# Patient Record
Sex: Female | Born: 1955 | Race: White | Hispanic: No | Marital: Married | Smoking: Never smoker
Health system: Southern US, Community
[De-identification: ages and names within clinical notes are randomized; demographics above are authoritative.]

## PROBLEM LIST (undated history)

## (undated) HISTORY — PX: COLONOSCOPY: SHX5424

---

## 2019-09-16 DIAGNOSIS — Z20828 Contact with and (suspected) exposure to other viral communicable diseases: Secondary | ICD-10-CM | POA: Diagnosis not present

## 2020-11-18 DIAGNOSIS — X32XXXS Exposure to sunlight, sequela: Secondary | ICD-10-CM | POA: Diagnosis not present

## 2020-11-18 DIAGNOSIS — Z808 Family history of malignant neoplasm of other organs or systems: Secondary | ICD-10-CM | POA: Diagnosis not present

## 2020-11-18 DIAGNOSIS — L820 Inflamed seborrheic keratosis: Secondary | ICD-10-CM | POA: Diagnosis not present

## 2020-11-18 DIAGNOSIS — L821 Other seborrheic keratosis: Secondary | ICD-10-CM | POA: Diagnosis not present

## 2020-11-18 DIAGNOSIS — L814 Other melanin hyperpigmentation: Secondary | ICD-10-CM | POA: Diagnosis not present

## 2020-11-18 DIAGNOSIS — D485 Neoplasm of uncertain behavior of skin: Secondary | ICD-10-CM | POA: Diagnosis not present

## 2021-01-07 ENCOUNTER — Ambulatory Visit: Payer: BC Managed Care – PPO | Admitting: Family Medicine

## 2021-01-07 ENCOUNTER — Encounter: Payer: Self-pay | Admitting: Family Medicine

## 2021-01-07 ENCOUNTER — Other Ambulatory Visit: Payer: Self-pay

## 2021-01-07 VITALS — BP 140/70 | HR 86 | Temp 98.1°F | Ht 64.0 in | Wt 151.4 lb

## 2021-01-07 DIAGNOSIS — R928 Other abnormal and inconclusive findings on diagnostic imaging of breast: Secondary | ICD-10-CM

## 2021-01-07 DIAGNOSIS — Z7689 Persons encountering health services in other specified circumstances: Secondary | ICD-10-CM

## 2021-01-07 NOTE — Progress Notes (Signed)
Jacqueline Reynolds is a 65 y.o. female  Chief Complaint  Patient presents with  . Establish Care    Np here to est care.  Pt need a referral for mammogram, she is UTD colonoscopy, bone dx,     HPI: Jacqueline Reynolds is a 65 y.o. female seen today to establish care with our office and needs referral for Lt diagnostic mammo to f/u on likely benign asymmetry in Lt breast on mammo in 09/2020. She is due in 03/2021 for 6 mo f/u.  She moved from [place].   Last PAP: 08/2020 Last mammo: 09/2020 - needs referral for Lt diagnostic mammo in 03/2021 Last colonoscopy: UTD as per pt, about 3-4 years ago and due for f/u in 10 years Last dexa: 08/2020 - osteoporosis  Last CPE, labs in 05/2020   History reviewed. No pertinent past medical history.  History reviewed. No pertinent surgical history.  Social History   Socioeconomic History  . Marital status: Married    Spouse name: Not on file  . Number of children: Not on file  . Years of education: Not on file  . Highest education level: Not on file  Occupational History  . Not on file  Tobacco Use  . Smoking status: Never Smoker  . Smokeless tobacco: Never Used  Substance and Sexual Activity  . Alcohol use: Not Currently  . Drug use: Not Currently  . Sexual activity: Not on file  Other Topics Concern  . Not on file  Social History Narrative  . Not on file   Social Determinants of Health   Financial Resource Strain: Not on file  Food Insecurity: Not on file  Transportation Needs: Not on file  Physical Activity: Not on file  Stress: Not on file  Social Connections: Not on file  Intimate Partner Violence: Not on file    Family History  Problem Relation Age of Onset  . Diabetes Mother   . Hypertension Mother   . Arthritis Mother   . Cancer Mother   . Hypertension Father       There is no immunization history on file for this patient.  Outpatient Encounter Medications as of 01/07/2021  Medication Sig  . Cholecalciferol (VITAMIN D3) 1.25  MG (50000 UT) CAPS Take by mouth.   No facility-administered encounter medications on file as of 01/07/2021.     ROS: Pertinent positives and negatives noted in HPI. Remainder of ROS non-contributory   No Known Allergies  BP 140/70 (BP Location: Left Arm, Patient Position: Sitting, Cuff Size: Normal)   Pulse 86   Temp 98.1 F (36.7 C) (Oral)   Ht 5\' 4"  (1.626 m)   Wt 151 lb 6.4 oz (68.7 kg)   SpO2 98%   BMI 25.99 kg/m   Wt Readings from Last 3 Encounters:  01/07/21 151 lb 6.4 oz (68.7 kg)   Temp Readings from Last 3 Encounters:  01/07/21 98.1 F (36.7 C) (Oral)   BP Readings from Last 3 Encounters:  01/07/21 140/70   Pulse Readings from Last 3 Encounters:  01/07/21 86     Physical Exam Constitutional:      General: She is not in acute distress.    Appearance: Normal appearance. She is not ill-appearing.  Pulmonary:     Effort: No respiratory distress.  Neurological:     Mental Status: She is alert and oriented to person, place, and time.  Psychiatric:        Mood and Affect: Mood normal.  Behavior: Behavior normal.      A/P:  1. Encounter to establish care with new doctor - due for CPE in 05/2021 - UTD on dexa, colonoscopy, PAP  2. Abnormal mammogram of left breast - due in 03/2021 for Lt diagnostic mammo after abnormal in 09/2020 - [facility]; Future    This visit occurred during the SARS-CoV-2 public health emergency.  Safety protocols were in place, including screening questions prior to the visit, additional usage of staff PPE, and extensive cleaning of exam room while observing appropriate contact time as indicated for disinfecting solutions.

## 2021-04-08 ENCOUNTER — Other Ambulatory Visit: Payer: Self-pay | Admitting: Family Medicine

## 2021-04-08 DIAGNOSIS — N6489 Other specified disorders of breast: Secondary | ICD-10-CM

## 2021-04-24 ENCOUNTER — Other Ambulatory Visit: Payer: Self-pay | Admitting: Family Medicine

## 2021-04-24 DIAGNOSIS — N6489 Other specified disorders of breast: Secondary | ICD-10-CM

## 2021-05-01 ENCOUNTER — Other Ambulatory Visit: Payer: Self-pay

## 2021-05-01 ENCOUNTER — Ambulatory Visit
Admission: RE | Admit: 2021-05-01 | Discharge: 2021-05-01 | Disposition: A | Discharge status: Home / Self Care | Payer: Medicare Other | Source: Ambulatory Visit | Attending: Family Medicine | Admitting: Family Medicine

## 2021-05-01 ENCOUNTER — Other Ambulatory Visit: Payer: Self-pay | Admitting: Family Medicine

## 2021-05-01 DIAGNOSIS — N6489 Other specified disorders of breast: Secondary | ICD-10-CM

## 2021-06-03 ENCOUNTER — Encounter: Payer: BLUE CROSS/BLUE SHIELD | Admitting: Family Medicine

## 2021-07-09 ENCOUNTER — Other Ambulatory Visit: Payer: Self-pay | Admitting: Family Medicine

## 2021-07-09 DIAGNOSIS — Z1231 Encounter for screening mammogram for malignant neoplasm of breast: Secondary | ICD-10-CM

## 2021-09-23 ENCOUNTER — Ambulatory Visit
Admission: RE | Admit: 2021-09-23 | Discharge: 2021-09-23 | Disposition: A | Discharge status: Home / Self Care | Payer: Medicare HMO | Source: Ambulatory Visit | Attending: Family Medicine | Admitting: Family Medicine

## 2021-09-23 DIAGNOSIS — Z1231 Encounter for screening mammogram for malignant neoplasm of breast: Secondary | ICD-10-CM

## 2022-07-30 ENCOUNTER — Other Ambulatory Visit: Payer: Self-pay | Admitting: Family Medicine

## 2022-07-30 DIAGNOSIS — Z1231 Encounter for screening mammogram for malignant neoplasm of breast: Secondary | ICD-10-CM

## 2022-07-30 DIAGNOSIS — M810 Age-related osteoporosis without current pathological fracture: Secondary | ICD-10-CM

## 2022-10-05 ENCOUNTER — Ambulatory Visit
Admission: RE | Admit: 2022-10-05 | Discharge: 2022-10-05 | Disposition: A | Discharge status: Home / Self Care | Payer: Medicare HMO | Source: Ambulatory Visit | Attending: Family Medicine | Admitting: Family Medicine

## 2022-10-05 DIAGNOSIS — Z1231 Encounter for screening mammogram for malignant neoplasm of breast: Secondary | ICD-10-CM

## 2023-02-02 ENCOUNTER — Other Ambulatory Visit: Payer: Medicare HMO

## 2023-02-02 ENCOUNTER — Ambulatory Visit
Admission: RE | Admit: 2023-02-02 | Discharge: 2023-02-02 | Disposition: A | Discharge status: Home / Self Care | Payer: Medicare HMO | Source: Ambulatory Visit | Attending: Family Medicine | Admitting: Family Medicine

## 2023-02-02 DIAGNOSIS — M810 Age-related osteoporosis without current pathological fracture: Secondary | ICD-10-CM

## 2023-09-23 ENCOUNTER — Other Ambulatory Visit: Payer: Self-pay | Admitting: Family Medicine

## 2023-09-23 DIAGNOSIS — Z1231 Encounter for screening mammogram for malignant neoplasm of breast: Secondary | ICD-10-CM

## 2023-10-11 ENCOUNTER — Ambulatory Visit
Admission: RE | Admit: 2023-10-11 | Discharge: 2023-10-11 | Disposition: A | Discharge status: Home / Self Care | Payer: Medicare HMO | Source: Ambulatory Visit | Attending: Family Medicine | Admitting: Family Medicine

## 2023-10-11 DIAGNOSIS — Z1231 Encounter for screening mammogram for malignant neoplasm of breast: Secondary | ICD-10-CM

## 2023-11-01 ENCOUNTER — Encounter (HOSPITAL_BASED_OUTPATIENT_CLINIC_OR_DEPARTMENT_OTHER): Payer: Self-pay | Admitting: Orthopaedic Surgery

## 2023-11-02 NOTE — H&P (Signed)
PREOPERATIVE H&P  Chief Complaint: LEFT DISTAL RADIUS FRACTURE  HPI: Jacqueline Reynolds is a 68 y.o. female who is scheduled for, Procedure(s): OPEN REDUCTION INTERNAL FIXATION (ORIF) DISTAL RADIUS FRACTURE.   The patient is a healthy, active 68 year old female who fell off a ski lift in [place] on Monday. She had some trouble getting back home. She now has numbness and tingling in the small finger but she thinks it is related to the splint.  No previous issues with her nerves. She had a wrist fracture which was reduced in the ER.   Symptoms are rated as moderate to severe, and have been worsening.  This is significantly impairing activities of daily living.    Please see clinic note for further details on this patient's care.    She has elected for surgical management.   No past medical history on file. Past Surgical History:  Procedure Laterality Date   COLONOSCOPY     Social History   Socioeconomic History   Marital status: Married    Spouse name: Not on file   Number of children: Not on file   Years of education: Not on file   Highest education level: Not on file  Occupational History   Not on file  Tobacco Use   Smoking status: Never   Smokeless tobacco: Never  Substance and Sexual Activity   Alcohol use: Not Currently   Drug use: Not Currently   Sexual activity: Not on file  Other Topics Concern   Not on file  Social History Narrative   Not on file   Social Drivers of Health   Financial Resource Strain: Not on file  Food Insecurity: Not on file  Transportation Needs: Not on file  Physical Activity: Sufficiently Active (09/09/2022)   [facility]   Exercise Vital Sign    Days of Exercise per Week: 5 days    Minutes of Exercise per Session: 40 min  Stress: Not on file  Social Connections: Not on file   Family History  Problem Relation Age of Onset   Diabetes Mother    Hypertension Mother    Arthritis Mother     Cancer Mother    Hypertension Father    BRCA 1/2 Neg Hx    Breast cancer Neg Hx    No Known Allergies Prior to Admission medications   Medication Sig Start Date End Date Taking? Authorizing Provider  calcium carbonate (OS-CAL - DOSED IN MG OF ELEMENTAL CALCIUM) 1250 (500 Ca) MG tablet Take 1 tablet by mouth.   Yes [provider]  Cholecalciferol (VITAMIN D3) 1.25 MG (50000 UT) CAPS Take by mouth.   Yes [provider]  magnesium 30 MG tablet Take 30 mg by mouth 2 (two) times daily.   Yes [provider]    ROS: All other systems have been reviewed and were otherwise negative with the exception of those mentioned in the HPI and as above.  Physical Exam: General: Alert, no acute distress Cardiovascular: No pedal edema Respiratory: No cyanosis, no use of accessory musculature GI: No organomegaly, abdomen is soft and non-tender Skin: No lesions in the area of chief complaint Neurologic: Sensation intact distally Psychiatric: Patient is competent for consent with normal mood and affect Lymphatic: No axillary or cervical lymphadenopathy  MUSCULOSKELETAL:  The range of motion is not tested in the early setting after a known fracture.  Sensation seems to be altered int the ulnar nerve but it is difficult  to assess motor and sensory function secondary to her splint.   Imaging: X-rays were imported into our system and reviewed demonstrating a complex intra-articular distal radius fracture of greater than three fragments with improvement after reduction but continued dorsal tilt.  Assessment: LEFT DISTAL RADIUS FRACTURE  Plan: Plan for Procedure(s): OPEN REDUCTION INTERNAL FIXATION (ORIF) DISTAL RADIUS FRACTURE  The risks benefits and alternatives were discussed with the patient including but not limited to the risks of nonoperative treatment, versus surgical intervention including infection, bleeding, nerve injury,  blood clots, cardiopulmonary complications,  morbidity, mortality, among others, and they were willing to proceed.   The patient acknowledged the explanation, agreed to proceed with the plan and consent was signed.   Operative Plan: ORIF left distal radius fracture Discharge Medications: standard DVT Prophylaxis: none Physical Therapy: outpatient Special Discharge needs: splint. sling   Vernetta Honey, PA-C  11/02/2023 8:36 PM

## 2023-11-03 ENCOUNTER — Ambulatory Visit (HOSPITAL_BASED_OUTPATIENT_CLINIC_OR_DEPARTMENT_OTHER): Payer: Medicare HMO | Admitting: Certified Registered"

## 2023-11-03 ENCOUNTER — Ambulatory Visit (HOSPITAL_BASED_OUTPATIENT_CLINIC_OR_DEPARTMENT_OTHER): Payer: Medicare HMO

## 2023-11-03 ENCOUNTER — Ambulatory Visit (HOSPITAL_BASED_OUTPATIENT_CLINIC_OR_DEPARTMENT_OTHER)
Admission: RE | Admit: 2023-11-03 | Discharge: 2023-11-03 | Disposition: A | Discharge status: Home / Self Care | Payer: Medicare HMO | Attending: Orthopaedic Surgery | Admitting: Orthopaedic Surgery

## 2023-11-03 ENCOUNTER — Other Ambulatory Visit: Payer: Self-pay

## 2023-11-03 ENCOUNTER — Encounter (HOSPITAL_BASED_OUTPATIENT_CLINIC_OR_DEPARTMENT_OTHER)
Admission: RE | Disposition: A | Discharge status: Home / Self Care | Payer: Self-pay | Source: Home / Self Care | Attending: Orthopaedic Surgery

## 2023-11-03 ENCOUNTER — Encounter (HOSPITAL_BASED_OUTPATIENT_CLINIC_OR_DEPARTMENT_OTHER): Payer: Self-pay | Admitting: Orthopaedic Surgery

## 2023-11-03 DIAGNOSIS — W1789XA Other fall from one level to another, initial encounter: Secondary | ICD-10-CM | POA: Insufficient documentation

## 2023-11-03 DIAGNOSIS — S52572A Other intraarticular fracture of lower end of left radius, initial encounter for closed fracture: Secondary | ICD-10-CM

## 2023-11-03 DIAGNOSIS — Z01818 Encounter for other preprocedural examination: Secondary | ICD-10-CM

## 2023-11-03 HISTORY — PX: OPEN REDUCTION INTERNAL FIXATION (ORIF) DISTAL RADIAL FRACTURE: SHX5989

## 2023-11-03 SURGERY — OPEN REDUCTION INTERNAL FIXATION (ORIF) DISTAL RADIUS FRACTURE
Anesthesia: Regional | Site: Wrist | Laterality: Left

## 2023-11-03 MED ORDER — ROPIVACAINE HCL 5 MG/ML IJ SOLN
INTRAMUSCULAR | Status: DC | PRN
  Administered 2023-11-03: 30 mL via PERINEURAL

## 2023-11-03 MED ORDER — MIDAZOLAM HCL 2 MG/2ML IJ SOLN
INTRAMUSCULAR | Status: AC
  Filled 2023-11-03: qty 2

## 2023-11-03 MED ORDER — PROPOFOL 500 MG/50ML IV EMUL
INTRAVENOUS | Status: DC | PRN
  Administered 2023-11-03: 100 ug/kg/min via INTRAVENOUS

## 2023-11-03 MED ORDER — ONDANSETRON HCL 4 MG/2ML IJ SOLN
INTRAMUSCULAR | Status: AC
  Filled 2023-11-03: qty 2

## 2023-11-03 MED ORDER — GABAPENTIN 300 MG PO CAPS
300.0000 mg | ORAL_CAPSULE | Freq: Once | ORAL | Status: AC
  Administered 2023-11-03: 300 mg via ORAL

## 2023-11-03 MED ORDER — PROPOFOL 10 MG/ML IV BOLUS
INTRAVENOUS | Status: DC | PRN
  Administered 2023-11-03: 30 mg via INTRAVENOUS

## 2023-11-03 MED ORDER — VANCOMYCIN HCL 1000 MG IV SOLR
INTRAVENOUS | Status: AC
  Filled 2023-11-03: qty 20

## 2023-11-03 MED ORDER — ONDANSETRON HCL 4 MG/2ML IJ SOLN
INTRAMUSCULAR | Status: DC | PRN
  Administered 2023-11-03: 4 mg via INTRAVENOUS

## 2023-11-03 MED ORDER — VITAMIN C 250 MG PO TABS: 500.0000 mg | ORAL_TABLET | Freq: Every day | ORAL | 0 refills | Status: AC

## 2023-11-03 MED ORDER — FENTANYL CITRATE (PF) 100 MCG/2ML IJ SOLN
50.0000 ug | Freq: Once | INTRAMUSCULAR | Status: AC
  Administered 2023-11-03: 50 ug via INTRAVENOUS

## 2023-11-03 MED ORDER — FENTANYL CITRATE (PF) 100 MCG/2ML IJ SOLN
INTRAMUSCULAR | Status: AC
  Filled 2023-11-03: qty 2

## 2023-11-03 MED ORDER — 0.9 % SODIUM CHLORIDE (POUR BTL) OPTIME
TOPICAL | Status: DC | PRN
  Administered 2023-11-03: 200 mL

## 2023-11-03 MED ORDER — PHENYLEPHRINE 80 MCG/ML (10ML) SYRINGE FOR IV PUSH (FOR BLOOD PRESSURE SUPPORT)
PREFILLED_SYRINGE | INTRAVENOUS | Status: AC
  Filled 2023-11-03: qty 30

## 2023-11-03 MED ORDER — PROPOFOL 500 MG/50ML IV EMUL
INTRAVENOUS | Status: AC
  Filled 2023-11-03: qty 50

## 2023-11-03 MED ORDER — ONDANSETRON HCL 4 MG PO TABS: 4.0000 mg | ORAL_TABLET | Freq: Three times a day (TID) | ORAL | 0 refills | Status: AC | PRN

## 2023-11-03 MED ORDER — GABAPENTIN 300 MG PO CAPS
ORAL_CAPSULE | ORAL | Status: AC
  Filled 2023-11-03: qty 1

## 2023-11-03 MED ORDER — MIDAZOLAM HCL 2 MG/2ML IJ SOLN: 1.0000 mg | Freq: Once | INTRAMUSCULAR | Status: DC

## 2023-11-03 MED ORDER — CEFAZOLIN SODIUM-DEXTROSE 2-4 GM/100ML-% IV SOLN
2.0000 g | INTRAVENOUS | Status: AC
  Administered 2023-11-03: 2 g via INTRAVENOUS

## 2023-11-03 MED ORDER — PHENYLEPHRINE HCL (PRESSORS) 10 MG/ML IV SOLN
INTRAVENOUS | Status: DC | PRN
  Administered 2023-11-03: 80 ug via INTRAVENOUS

## 2023-11-03 MED ORDER — LACTATED RINGERS IV SOLN
INTRAVENOUS | Status: DC
Start: 2023-11-03 — End: 2023-11-03

## 2023-11-03 MED ORDER — CEFAZOLIN SODIUM-DEXTROSE 2-4 GM/100ML-% IV SOLN
INTRAVENOUS | Status: AC
  Filled 2023-11-03: qty 100

## 2023-11-03 MED ORDER — MIDAZOLAM HCL 2 MG/2ML IJ SOLN
2.0000 mg | Freq: Once | INTRAMUSCULAR | Status: AC
  Administered 2023-11-03: 2 mg via INTRAVENOUS

## 2023-11-03 MED ORDER — ACETAMINOPHEN 500 MG PO TABS
1000.0000 mg | ORAL_TABLET | Freq: Once | ORAL | Status: AC
  Administered 2023-11-03: 1000 mg via ORAL

## 2023-11-03 MED ORDER — ACETAMINOPHEN 500 MG PO TABS
ORAL_TABLET | ORAL | Status: AC
  Filled 2023-11-03: qty 2

## 2023-11-03 MED ORDER — OXYCODONE HCL 5 MG PO TABS: ORAL_TABLET | ORAL | 0 refills | Status: AC

## 2023-11-03 MED ORDER — VANCOMYCIN HCL 1000 MG IV SOLR
INTRAVENOUS | Status: DC | PRN
  Administered 2023-11-03: 1000 mg via TOPICAL

## 2023-11-03 MED ORDER — SODIUM CHLORIDE 0.9 % IV SOLN
INTRAVENOUS | Status: DC | PRN
Start: 2023-11-03 — End: 2023-11-03

## 2023-11-03 MED ORDER — PROPOFOL 10 MG/ML IV BOLUS
INTRAVENOUS | Status: AC
  Filled 2023-11-03: qty 20

## 2023-11-03 SURGICAL SUPPLY — 56 items
BIT DRILL 2.2 SS TIBIAL (BIT) IMPLANT
BLADE HEX COATED 2.75 (ELECTRODE) ×1 IMPLANT
BLADE SURG 15 STRL LF DISP TIS (BLADE) ×1 IMPLANT
BNDG COHESIVE 3X5 TAN ST LF (GAUZE/BANDAGES/DRESSINGS) ×1 IMPLANT
BNDG ELASTIC 3INX 5YD STR LF (GAUZE/BANDAGES/DRESSINGS) ×1 IMPLANT
BNDG ELASTIC 4INX 5YD STR LF (GAUZE/BANDAGES/DRESSINGS) ×1 IMPLANT
BNDG ESMARK 4X9 LF (GAUZE/BANDAGES/DRESSINGS) ×1 IMPLANT
BRUSH SCRUB EZ PLAIN DRY (MISCELLANEOUS) ×1 IMPLANT
CANISTER SUCT 1200ML W/VALVE (MISCELLANEOUS) IMPLANT
CLSR STERI-STRIP ANTIMIC 1/2X4 (GAUZE/BANDAGES/DRESSINGS) ×1 IMPLANT
COVER BACK TABLE 60X90IN (DRAPES) ×1 IMPLANT
CUFF TOURN SGL QUICK 18X4 (TOURNIQUET CUFF) ×1 IMPLANT
DRAPE EXTREMITY T 121X128X90 (DISPOSABLE) ×1 IMPLANT
DRAPE IMP U-DRAPE 54X76 (DRAPES) ×1 IMPLANT
DRAPE OEC MINIVIEW 54X84 (DRAPES) ×1 IMPLANT
DRAPE SURG 17X23 STRL (DRAPES) ×1 IMPLANT
ELECT REM PT RETURN 9FT ADLT (ELECTROSURGICAL) ×1 IMPLANT
ELECTRODE REM PT RTRN 9FT ADLT (ELECTROSURGICAL) ×1 IMPLANT
GAUZE SPONGE 4X4 12PLY STRL (GAUZE/BANDAGES/DRESSINGS) ×1 IMPLANT
GLOVE BIO SURGEON STRL SZ 6.5 (GLOVE) ×1 IMPLANT
GLOVE BIOGEL PI IND STRL 6.5 (GLOVE) ×1 IMPLANT
GLOVE BIOGEL PI IND STRL 8 (GLOVE) ×1 IMPLANT
GLOVE ECLIPSE 8.0 STRL XLNG CF (GLOVE) ×1 IMPLANT
GOWN STRL REUS W/ TWL LRG LVL3 (GOWN DISPOSABLE) ×1 IMPLANT
GOWN STRL REUS W/ TWL XL LVL3 (GOWN DISPOSABLE) IMPLANT
GOWN STRL REUS W/TWL XL LVL3 (GOWN DISPOSABLE) ×1 IMPLANT
K-WIRE FX5X1.6XNS BN SS (WIRE) ×2 IMPLANT
KWIRE FX5X1.6XNS BN SS (WIRE) IMPLANT
NDL HYPO 25X1 1.5 SAFETY (NEEDLE) ×1 IMPLANT
NEEDLE HYPO 25X1 1.5 SAFETY (NEEDLE) ×1 IMPLANT
NS IRRIG 1000ML POUR BTL (IV SOLUTION) IMPLANT
PACK BASIN DAY SURGERY FS (CUSTOM PROCEDURE TRAY) ×1 IMPLANT
PAD CAST 4YDX4 CTTN HI CHSV (CAST SUPPLIES) ×1 IMPLANT
PEG LOCKING SMOOTH 2.2X14 (Peg) IMPLANT
PEG LOCKING SMOOTH 2.2X16 (Screw) IMPLANT
PEG LOCKING SMOOTH 2.2X18 (Peg) IMPLANT
PEG LOCKING SMOOTH 2.2X20 (Screw) IMPLANT
PENCIL SMOKE EVACUATOR (MISCELLANEOUS) ×1 IMPLANT
PLATE NARROW DVR LEFT (Plate) IMPLANT
SCREW BN 14X2.7XNONLOCK 3 LD (Screw) IMPLANT
SCREW LOCKING 2.7X13MM (Screw) IMPLANT
SCREW LP NL 2.7X13MM (Screw) IMPLANT
SCREW NONLOCK 2.7X20MM (Screw) IMPLANT
SLEEVE SCD COMPRESS KNEE MED (STOCKING) ×1 IMPLANT
SPIKE FLUID TRANSFER (MISCELLANEOUS) IMPLANT
SPLINT PLASTER CAST XFAST 3X15 (CAST SUPPLIES) ×10 IMPLANT
SPONGE T-LAP 4X18 ~~LOC~~+RFID (SPONGE) IMPLANT
SUCTION TUBE FRAZIER 10FR DISP (SUCTIONS) IMPLANT
SUT MNCRL AB 4-0 PS2 18 (SUTURE) ×1 IMPLANT
SUT VIC AB 3-0 SH 27X BRD (SUTURE) IMPLANT
SYR BULB EAR ULCER 3OZ GRN STR (SYRINGE) ×1 IMPLANT
SYR CONTROL 10ML LL (SYRINGE) ×1 IMPLANT
TOWEL GREEN STERILE FF (TOWEL DISPOSABLE) ×1 IMPLANT
TRAY DSU PREP LF (CUSTOM PROCEDURE TRAY) ×1 IMPLANT
TUBE CONNECTING 20X1/4 (TUBING) ×1 IMPLANT
YANKAUER SUCT BULB TIP NO VENT (SUCTIONS) IMPLANT

## 2023-11-03 NOTE — Anesthesia Postprocedure Evaluation (Signed)
Anesthesia Post Note  Patient: Jacqueline Reynolds  Procedure(s) Performed: OPEN REDUCTION INTERNAL FIXATION (ORIF) DISTAL RADIUS FRACTURE (Left: Wrist)     Patient location during evaluation: PACU Anesthesia Type: Regional and MAC Level of consciousness: awake and alert Pain management: pain level controlled Vital Signs Assessment: post-procedure vital signs reviewed and stable Respiratory status: spontaneous breathing, nonlabored ventilation and respiratory function stable Cardiovascular status: blood pressure returned to baseline and stable Postop Assessment: no apparent nausea or vomiting Anesthetic complications: no   No notable events documented.  Last Vitals:  Vitals:   11/03/23 1315 11/03/23 1335  BP: 129/76 (!) 154/82  Pulse: 68 80  Resp: 14 14  Temp:  (!) 36.3 C  SpO2: 97% 97%    Last Pain:  Vitals:   11/03/23 1335  TempSrc:   PainSc: 0-No pain                 Lowella Curb

## 2023-11-03 NOTE — Progress Notes (Signed)
Assisted Dr. Bradley Ferris with left, supraclavicular, ultrasound guided block. Side rails up, monitors on throughout procedure. See vital signs in flow sheet. Tolerated Procedure well.

## 2023-11-03 NOTE — Discharge Instructions (Addendum)
 Ramond Marrow MD, MPH Alfonse Alpers, PA-C [facility] [address] [phone] (tel)   [phone] (fax)   POST-OPERATIVE INSTRUCTIONS   WOUND CARE Please keep splint clean dry and intact until followup.  You may shower on Post-Op Day #3.  You must keep splint dry during this process and may find that a plastic bag taped around the extremity or alternatively a towel based bath may be a better option.   If you get your splint wet or if it is damaged please contact our clinic.  EXERCISES Due to your splint being in place you will not be able to bear weight through your extremity.    You may use a sling for comfort   It is normal for your fingers/hand to become more swollen after surgery and discolored from bruising.   This will resolve over the first few weeks usually after surgery. Please continue to ambulate and do not stay sitting or lying for too long.  Perform foot and wrist pumps to assist in circulation.   REGIONAL ANESTHESIA (NERVE BLOCKS) The anesthesia team may have performed a nerve block for you this is a great tool used to minimize pain.   The block may start wearing off overnight (between 8-24 hours postop) When the block wears off, your pain may go from nearly zero to the pain you would have had postop without the block. This is an abrupt transition but nothing dangerous is happening.   This can be a challenging period but utilize your as needed pain medications to try and manage this period. We suggest you use the pain medication the first night prior to going to bed, to ease this transition.  You may take an extra dose of narcotic when this happens if needed   POST-OP MEDICATIONS- Multimodal approach to pain control In general your pain will be controlled with a combination of substances.  Prescriptions unless otherwise discussed are electronically sent to your pharmacy.  This is a carefully made plan we use to minimize  narcotic use.     Celebrex - Anti-inflammatory medication taken on a scheduled basis Acetaminophen - Non-narcotic pain medicine taken on a scheduled basis  Oxycodone - This is a strong narcotic, to be used only on an "as needed" basis for SEVERE pain. Zofran - take as needed for nausea Vitamin C - take daily for 60 days  FOLLOW-UP If you develop a Fever (>101.5), Redness or Drainage from the surgical incision site, please call our office to arrange for an evaluation. Please call the office to schedule a follow-up appointment for your incision check if you do not already have one, 7-10 days post-operatively.   HELPFUL INFORMATION    You may be more comfortable sleeping in a semi-seated position the first few nights following surgery.  Keep a pillow propped under the elbow and forearm for comfort.  If you have a recliner type of chair it might be beneficial.  If not that is fine too, but it would be helpful to sleep propped up with pillows behind your operated shoulder as well under your elbow and forearm.  This will reduce pulling on the suture lines.   When dressing, put your operative arm in the sleeve first.  When getting undressed, take your operative arm out last.  Loose fitting, button-down shirts are recommended.  Often in the first days after surgery you may be more comfortable keeping your operative arm under your shirt and not through the sleeve.  You may return to work/school in the next couple of days when you feel up to it.  Desk work and typing in the sling is fine.   We suggest you use the pain medication the first night prior to going to bed, in order to ease any pain when the anesthesia wears off. You should avoid taking pain medications on an empty stomach as it will make you nauseous.   You should wean off your narcotic medicines as soon as you are able.  Most patients will be off narcotics before their first postop appointment.    Do not drink alcoholic beverages or take  illicit drugs when taking pain medications.   It is against the law to drive while taking narcotics.  In some states it is against the law to drive while your arm is in a sling.    Pain medication may make you constipated.  Below are a few solutions to try in this order:   - Decrease the amount of pain medication if you aren't having pain.   - Drink lots of decaffeinated fluids.   - Drink prune juice and/or each dried prunes   If the first 3 don't work start with additional solutions   - Take Colace - an over-the-counter stool softener   - Take Senokot - an over-the-counter laxative   - Take Miralax - a stronger over-the-counter laxative   For more information including helpful videos and documents visit our website:   [email or web address]    Post Anesthesia Home Care Instructions  Activity: Get plenty of rest for the remainder of the day. A responsible individual must stay with you for 24 hours following the procedure.  For the next 24 hours, DO NOT: -Drive a car -Advertising copywriter -Drink alcoholic beverages -Take any medication unless instructed by your physician -Make any legal decisions or sign important papers.  Meals: Start with liquid foods such as gelatin or soup. Progress to regular foods as tolerated. Avoid greasy, spicy, heavy foods. If nausea and/or vomiting occur, drink only clear liquids until the nausea and/or vomiting subsides. Call your physician if vomiting continues.  Special Instructions/Symptoms: Your throat may feel dry or sore from the anesthesia or the breathing tube placed in your throat during surgery. If this causes discomfort, gargle with warm salt water. The discomfort should disappear within 24 hours.  If you had a scopolamine patch placed behind your ear for the management of post- operative nausea and/or vomiting:  1. The medication in the patch is effective for 72 hours, after which it should be removed.  Wrap  patch in a tissue and discard in the trash. Wash hands thoroughly with soap and water. 2. You may remove the patch earlier than 72 hours if you experience unpleasant side effects which may include dry mouth, dizziness or visual disturbances. 3. Avoid touching the patch. Wash your hands with soap and water after contact with the patch.      Next dose of Tyelnol may be taken at 5p  Regional Anesthesia Blocks  1. You may not be able to move or feel the "blocked" extremity after a regional anesthetic block. This may last may last from 3-48 hours after placement, but it will go away. The length of time depends on the medication injected and your individual response to the medication. As the nerves start to wake up, you may experience tingling as the movement and feeling returns to your extremity. If the numbness and inability to move your extremity has not gone  away after 48 hours, please call your surgeon.   2. The extremity that is blocked will need to be protected until the numbness is gone and the strength has returned. Because you cannot feel it, you will need to take extra care to avoid injury. Because it may be weak, you may have difficulty moving it or using it. You may not know what position it is in without looking at it while the block is in effect.  3. For blocks in the legs and feet, returning to weight bearing and walking needs to be done carefully. You will need to wait until the numbness is entirely gone and the strength has returned. You should be able to move your leg and foot normally before you try and bear weight or walk. You will need someone to be with you when you first try to ensure you do not fall and possibly risk injury.  4. Bruising and tenderness at the needle site are common side effects and will resolve in a few days.  5. Persistent numbness or new problems with movement should be communicated to the surgeon or the [facility] [phone] [facility] [phone]).

## 2023-11-03 NOTE — Anesthesia Procedure Notes (Signed)
Anesthesia Regional Block: Supraclavicular block   Pre-Anesthetic Checklist: , timeout performed,  Correct Patient, Correct Site, Correct Laterality,  Correct Procedure, Correct Position, site marked,  Risks and benefits discussed,  Surgical consent,  Pre-op evaluation,  At surgeon's request and post-op pain management  Laterality: Left  Prep: chloraprep       Needles:  Injection technique: Single-shot  Needle Type: Echogenic Stimulator Needle     Needle Length: 9cm  Needle Gauge: 21     Additional Needles:   Procedures:,,,, ultrasound used (permanent image in chart),,    Narrative:  Start time: 11/03/2023 11:35 AM End time: 11/03/2023 11:45 AM Injection made incrementally with aspirations every 5 mL.  Performed by: Personally  Anesthesiologist: Leonides Grills, MD  Additional Notes: Functioning IV was confirmed and monitors were applied.  A timeout was performed. Sterile prep, hand hygiene and sterile gloves were used. A 90mm 21ga Arrow echogenic stimulator needle was used. Negative aspiration and negative test dose prior to incremental administration of local anesthetic. The patient tolerated the procedure well.  Ultrasound guidance: relevent anatomy identified, needle position confirmed, local anesthetic spread visualized around nerve(s), vascular puncture avoided.  Image printed for medical record.

## 2023-11-03 NOTE — Interval H&P Note (Signed)
All questions answered, patient wants to proceed with procedure. ? ?

## 2023-11-03 NOTE — Transfer of Care (Signed)
Immediate Anesthesia Transfer of Care Note  Patient: Jacqueline Reynolds  Procedure(s) Performed: OPEN REDUCTION INTERNAL FIXATION (ORIF) DISTAL RADIUS FRACTURE (Left: Wrist)  Patient Location: PACU  Anesthesia Type:MAC and Regional  Level of Consciousness: awake, drowsy, and patient cooperative  Airway & Oxygen Therapy: Patient Spontanous Breathing  Post-op Assessment: Report given to RN and Post -op Vital signs reviewed and stable  Post vital signs: Reviewed and stable  Last Vitals:  Vitals Value Taken Time  BP    Temp    Pulse 76 11/03/23 1254  Resp 12 11/03/23 1254  SpO2 96 % 11/03/23 1254  Vitals shown include unfiled device data.  Last Pain:  Vitals:   11/03/23 1136  TempSrc:   PainSc: 6          Complications: No notable events documented.

## 2023-11-03 NOTE — Anesthesia Preprocedure Evaluation (Addendum)
Anesthesia Evaluation  Patient identified by MRN, date of birth, ID band Patient awake    Reviewed: Allergy & Precautions, NPO status , Patient's Chart, lab work & pertinent test results  Airway Mallampati: II  TM Distance: >3 FB Neck ROM: Full    Dental  (+) Missing   Pulmonary neg pulmonary ROS   Pulmonary exam normal        Cardiovascular negative cardio ROS Normal cardiovascular exam     Neuro/Psych negative neurological ROS  negative psych ROS   GI/Hepatic negative GI ROS, Neg liver ROS,,,  Endo/Other  negative endocrine ROS    Renal/GU negative Renal ROS     Musculoskeletal   Abdominal   Peds  Hematology negative hematology ROS (+)   Anesthesia Other Findings LEFT DISTAL RADIUS FRACTURE  Reproductive/Obstetrics                             Anesthesia Physical Anesthesia Plan  ASA: 1  Anesthesia Plan: Regional   Post-op Pain Management:    Induction: Intravenous  PONV Risk Score and Plan: 2 and Ondansetron, Dexamethasone, Propofol infusion, Midazolam and Treatment may vary due to age or medical condition  Airway Management Planned: Simple Face Mask  Additional Equipment:   Intra-op Plan:   Post-operative Plan:   Informed Consent: I have reviewed the patients History and Physical, chart, labs and discussed the procedure including the risks, benefits and alternatives for the proposed anesthesia with the patient or authorized representative who has indicated his/her understanding and acceptance.     Dental advisory given  Plan Discussed with: CRNA  Anesthesia Plan Comments:        Anesthesia Quick Evaluation

## 2023-11-03 NOTE — Op Note (Signed)
 Orthopaedic Surgery Operative Note (CSN: [id number])  Jacqueline Reynolds  Jan 28, 1956 Date of Surgery: 11/03/2023   Diagnoses:  Left intra-articular distal radius fracture greater than 3 parts  Procedure: Left open reduction internal fixation left distal radius fracture greater than 3 parts intra-articular   Operative Finding Successful completion of the planned procedure.  Good fixation overall, there was comminution at the fracture site and we had good reproduction of the volar tilt and radial inclination.  Post-operative plan: The patient will be discharged home.  The patient will be nonweightbearing in a demisplint.  DVT prophylaxis not indicated in this ambulatory upper extremity patient without significant risk factors.   Pain control with PRN pain medication preferring oral medicines.  Follow up plan will be scheduled in approximately 7 days for incision check and XR.  Post-Op Diagnosis: Same Surgeons:Primary: Bjorn Pippin, MD Assistants:Caroline McBane, PA-C Location: [facility] OR ROOM 6 Anesthesia: Sedation plus regional anesthesia Antibiotics: Ancef 2 g with local vancomycin powder 1 g at the surgical site Tourniquet time:  Total Tourniquet Time Documented: Upper Arm (Left) - 14 minutes Total: Upper Arm (Left) - 14 minutes  Estimated Blood Loss: Minimal Complications: None Specimens: None Implants: Implant Name Type Inv. Item Serial No. Manufacturer Lot No. LRB No. Used Action  PLATE NARROW DVR LEFT - [id number] Plate PLATE NARROW DVR LEFT  [employer](ORTH,TRAU,BIO,SG)  Left 1 Implanted  PEG LOCKING SMOOTH 2.2X14 - [id number] Peg PEG LOCKING SMOOTH 2.2X14  [employer](ORTH,TRAU,BIO,SG)  Left 1 Implanted  PEG LOCKING SMOOTH 2.2X16 - [id number] Screw PEG LOCKING SMOOTH 2.2X16  [employer](ORTH,TRAU,BIO,SG)  Left 1 Implanted  PEG LOCKING SMOOTH 2.2X18 - [id number] Peg PEG LOCKING SMOOTH 2.2X18  [employer](ORTH,TRAU,BIO,SG)  Left 1 Implanted  PEG LOCKING SMOOTH 2.2X20 - [id number]  Screw PEG LOCKING SMOOTH 2.2X20  [employer](ORTH,TRAU,BIO,SG)  Left 2 Implanted  SCREW LOCKING 2.7X13MM - [id number] Screw SCREW LOCKING 2.7X13MM  [employer](ORTH,TRAU,BIO,SG)  Left 1 Implanted  SCREW LP NL 2.7X13MM - [id number] Screw SCREW LP NL 2.7X13MM  [employer](ORTH,TRAU,BIO,SG)  Left 1 Implanted  SCREW BN 14X2.7XNONLOCK 3 LD - [id number] Screw SCREW BN 14X2.7XNONLOCK 3 LD  [employer](ORTH,TRAU,BIO,SG)  Left 1 Implanted  SCREW NONLOCK 2.7X20MM - [id number] Screw SCREW NONLOCK 2.7X20MM  [employer](ORTH,TRAU,BIO,SG)  Left 1 Implanted    Indications for Surgery:   Jacqueline Reynolds is a 68 y.o. female with fall resulting in comminuted distal radius fracture with significant angulation and active patient.  Benefits and risks of operative and nonoperative management were discussed prior to surgery with patient/guardian(s) and informed consent form was completed.  Specific risks including infection, need for additional surgery, nonunion, malunion, hardware prominence and neurovascular damage amongst others.   Procedure:   The patient was identified properly. Informed consent was obtained and the surgical site was marked. The patient was taken up to suite where anesthesia was induced.  The patient was positioned supine on a regular bed.  The left wrist was prepped and draped in the usual sterile fashion.  Timeout was performed before the beginning of the case.  Tourniquet was used for the above duration.  An FCR approach was made exposing the volar surface of the distal radius taking care to go through the sheath of the FCR tendon tract and ulnarly exposing the inferior portion of the sheath while protecting the median nerve and radial artery on each side with blunt retractors.  This inferior portion of the sheath was incised sharply and examined for presence of the palmar cutaneous branch of the  median nerve.  It was determined to not be within the field and we carried our dissection deeply to  the bone splitting the pronator quadratus and exposing the fracture site.    Appropriate reduction was obtained and a narrow standard DVR Biomet plate was placed and checked for sizing and reduction under fluoroscopy.  This reduction was held in place with K wires and a K wire was placed into the radial styloid.    Once appropriate reduction was confirmed we then proceeded to fix the plate proximally and then proceeded to fill the distal holes with a combination of partially threaded screws and pegs.  At this point we checked our reduction to ensure that there was no intra-articular extension of our screws.  Once this was confirmed we proceeded to fill remaining 3 proximal shaft screws and obtained final images which demonstrated appropriate reduction and maintenance of alignment.  The DRUJ was checked and found to be stable.  We verified that all fast guides were removed on XR and through count.    The wound was thoroughly irrigated.  The tourniquet was released prior to skin closure to verify there was no excessive bleeding and we visualized that the radial artery and median nerve were intact at the end of the case. The PQ was reapproximated grossly prior to skin closure.     We irrigated the wound copiously before placing local antibiotic as listed above.  We closed the incision in a multilayer fashion with absorbable suture.  Sterile dressing was placed.  Demi splint placed. Patient was awoken taken to PACU in stable condition.  Alfonse Alpers, PA-C, present and scrubbed throughout the case, critical for completion in a timely fashion, and for retraction, instrumentation, closure.

## 2023-11-04 ENCOUNTER — Encounter (HOSPITAL_BASED_OUTPATIENT_CLINIC_OR_DEPARTMENT_OTHER): Payer: Self-pay | Admitting: Orthopaedic Surgery

## 2023-11-09 ENCOUNTER — Emergency Department (HOSPITAL_BASED_OUTPATIENT_CLINIC_OR_DEPARTMENT_OTHER): Payer: Medicare HMO

## 2023-11-09 ENCOUNTER — Other Ambulatory Visit: Payer: Self-pay

## 2023-11-09 ENCOUNTER — Emergency Department (HOSPITAL_BASED_OUTPATIENT_CLINIC_OR_DEPARTMENT_OTHER)
Admission: EM | Admit: 2023-11-09 | Discharge: 2023-11-09 | Disposition: A | Discharge status: Home / Self Care | Payer: Medicare HMO | Attending: Emergency Medicine | Admitting: Emergency Medicine

## 2023-11-09 ENCOUNTER — Encounter (HOSPITAL_BASED_OUTPATIENT_CLINIC_OR_DEPARTMENT_OTHER): Payer: Self-pay

## 2023-11-09 DIAGNOSIS — R531 Weakness: Secondary | ICD-10-CM | POA: Diagnosis not present

## 2023-11-09 DIAGNOSIS — I10 Essential (primary) hypertension: Secondary | ICD-10-CM | POA: Insufficient documentation

## 2023-11-09 DIAGNOSIS — R42 Dizziness and giddiness: Secondary | ICD-10-CM | POA: Insufficient documentation

## 2023-11-09 DIAGNOSIS — Z79899 Other long term (current) drug therapy: Secondary | ICD-10-CM | POA: Insufficient documentation

## 2023-11-09 DIAGNOSIS — R202 Paresthesia of skin: Secondary | ICD-10-CM | POA: Diagnosis not present

## 2023-11-09 DIAGNOSIS — Z1152 Encounter for screening for COVID-19: Secondary | ICD-10-CM | POA: Insufficient documentation

## 2023-11-09 LAB — COMPREHENSIVE METABOLIC PANEL
ALT: 63 U/L — ABNORMAL HIGH (ref 0–44)
AST: 44 U/L — ABNORMAL HIGH (ref 15–41)
Albumin: 4.2 g/dL (ref 3.5–5.0)
Alkaline Phosphatase: 109 U/L (ref 38–126)
Anion gap: 13 (ref 5–15)
BUN: 17 mg/dL (ref 8–23)
CO2: 24 mmol/L (ref 22–32)
Calcium: 9.2 mg/dL (ref 8.9–10.3)
Chloride: 100 mmol/L (ref 98–111)
Creatinine, Ser: 0.85 mg/dL (ref 0.44–1.00)
GFR, Estimated: >60 mL/min (ref >60)
Glucose, Bld: 108 mg/dL — ABNORMAL HIGH (ref 70–99)
Potassium: 4.3 mmol/L (ref 3.5–5.1)
Sodium: 137 mmol/L (ref 135–145)
Total Bilirubin: 0.9 mg/dL (ref 0.0–1.2)
Total Protein: 7.6 g/dL (ref 6.5–8.1)

## 2023-11-09 LAB — CBC WITH DIFFERENTIAL/PLATELET
Abs Immature Granulocytes: 0.02 10*3/uL (ref 0.00–0.07)
Basophils Absolute: 0 10*3/uL (ref 0.0–0.1)
Basophils Relative: 1 %
Eosinophils Absolute: 0.1 10*3/uL (ref 0.0–0.5)
Eosinophils Relative: 1 %
HCT: 42.2 % (ref 36.0–46.0)
Hemoglobin: 14.2 g/dL (ref 12.0–15.0)
Immature Granulocytes: 0 %
Lymphocytes Relative: 23 %
Lymphs Abs: 1.3 10*3/uL (ref 0.7–4.0)
MCH: 31 pg (ref 26.0–34.0)
MCHC: 33.6 g/dL (ref 30.0–36.0)
MCV: 92.1 fL (ref 80.0–100.0)
Monocytes Absolute: 0.4 10*3/uL (ref 0.1–1.0)
Monocytes Relative: 8 %
Neutro Abs: 3.7 10*3/uL (ref 1.7–7.7)
Neutrophils Relative %: 67 %
Platelets: 273 10*3/uL (ref 150–400)
RBC: 4.58 MIL/uL (ref 3.87–5.11)
RDW: 12.3 % (ref 11.5–15.5)
WBC: 5.6 10*3/uL (ref 4.0–10.5)
nRBC: 0 % (ref 0.0–0.2)

## 2023-11-09 LAB — RESP PANEL BY RT-PCR (RSV, FLU A&B, COVID)  RVPGX2
Influenza A by PCR: NEGATIVE
Influenza B by PCR: NEGATIVE
Resp Syncytial Virus by PCR: NEGATIVE
SARS Coronavirus 2 by RT PCR: NEGATIVE

## 2023-11-09 LAB — URINALYSIS, ROUTINE W REFLEX MICROSCOPIC
Bilirubin Urine: NEGATIVE
Glucose, UA: NEGATIVE mg/dL
Ketones, ur: NEGATIVE mg/dL
Leukocytes,Ua: NEGATIVE
Nitrite: NEGATIVE
Protein, ur: NEGATIVE mg/dL
Specific Gravity, Urine: 1.01 (ref 1.005–1.030)
pH: 6.5 (ref 5.0–8.0)

## 2023-11-09 LAB — URINALYSIS, MICROSCOPIC (REFLEX): WBC, UA: NONE SEEN WBC/hpf (ref 0–5)

## 2023-11-09 LAB — TROPONIN I (HIGH SENSITIVITY)
Troponin I (High Sensitivity): 2 ng/L (ref <18)
Troponin I (High Sensitivity): 3 ng/L (ref <18)

## 2023-11-09 MED ORDER — IOHEXOL 350 MG/ML SOLN
75.0000 mL | Freq: Once | INTRAVENOUS | Status: AC | PRN
  Administered 2023-11-09: 75 mL via INTRAVENOUS

## 2023-11-09 MED ORDER — ACETAMINOPHEN 500 MG PO TABS
1000.0000 mg | ORAL_TABLET | Freq: Once | ORAL | Status: DC
  Filled 2023-11-09: qty 2

## 2023-11-09 MED ORDER — SODIUM CHLORIDE 0.9 % IV BOLUS
1000.0000 mL | Freq: Once | INTRAVENOUS | Status: AC
  Administered 2023-11-09: 1000 mL via INTRAVENOUS

## 2023-11-09 MED ORDER — ACETAMINOPHEN 160 MG/5ML PO SOLN
650.0000 mg | Freq: Once | ORAL | Status: AC
  Administered 2023-11-09: 650 mg via ORAL
  Filled 2023-11-09: qty 20.3

## 2023-11-09 NOTE — ED Notes (Addendum)
 Patient transported to MRI

## 2023-11-09 NOTE — Discharge Instructions (Addendum)
 Your CT scan showed some atherosclerosis of your left carotid artery as well as some small vessel disease in your MRI.  These are common findings as you get older and you should follow close with your primary care doctor.  Your lab work was otherwise quite reassuring, you do have slightly elevated liver enzymes you should have this rechecked by your doctor.  I recommend you follow close with your primary care doctor.  If you develop chest pain, shortness of breath, fainting, vision changes, severe headache, focal weakness or numbness you should return to the ED.  You should make sure you are eating and drinking plenty.

## 2023-11-09 NOTE — ED Notes (Signed)
 Patient transported to MRI

## 2023-11-09 NOTE — ED Notes (Signed)
Dr.Young at bedside for evaluation

## 2023-11-09 NOTE — ED Provider Notes (Signed)
 [facility] Provider Note   CSN: [id number] Arrival date & time: 11/09/23  1443     History  Chief Complaint  Patient presents with   Weakness   Dizziness    Britteny C Lundberg is a 68 y.o. female.   Weakness Associated symptoms: dizziness   Dizziness Associated symptoms: weakness    68 year old female presenting for dizziness and generalized weakness and feeling tingling.  6 days ago patient had an open reduction about left wrist fracture.  Patient states she has had some tingling to her left leg for a few days.  She attributed this to possible neuropathy.  She is been taking gabapentin since Sunday intermittently.  She felt okay yesterday, however woke up this morning and just did not feel well.  Fatigued, tired.  Sometime a few hours ago she noted that she felt dizzy.  She describes as feeling like she is going to pass out and presyncope.  Started while she was moving some boxes.  She also developed a tingling feeling all over.  It is both arms, both legs, and her scalp.  It feels tingly, not quite paresthesia.  It is not painful, no numbness.  She feels generalized weakness all over but no focal weakness.  No vision changes or double vision.  No difficulty with speech.  She has not had symptoms quite like this before.  She does have history of migraines but does not have a headache currently.  No fevers or chills.  No vomiting or diarrhea.  She has not eaten at all today.     Home Medications Prior to Admission medications   Medication Sig Start Date End Date Taking? Authorizing Provider  calcium carbonate (OS-CAL - DOSED IN MG OF ELEMENTAL CALCIUM) 1250 (500 Ca) MG tablet Take 1 tablet by mouth.    [provider]  Cholecalciferol (VITAMIN D3) 1.25 MG (50000 UT) CAPS Take by mouth.    [provider]  magnesium 30 MG tablet Take 30 mg by mouth 2 (two) times daily.    [provider]  ondansetron (ZOFRAN) 4 MG  tablet Take 1 tablet (4 mg total) by mouth every 8 (eight) hours as needed for up to 7 days for nausea or vomiting. 11/03/23 11/10/23  Vernetta Honey, PA-C  vitamin C (ASCORBIC ACID) 250 MG tablet Take 2 tablets (500 mg total) by mouth daily. 11/03/23 01/02/24  Vernetta Honey, PA-C      Allergies    Patient has no known allergies.    Review of Systems   Review of Systems  Neurological:  Positive for dizziness and weakness.  Review of systems completed and notable as per HPI.  ROS otherwise negative.   Physical Exam Updated Vital Signs BP (!) 162/84   Pulse 79   Temp 98.2 F (36.8 C) (Oral)   Resp 13   Ht 5\' 3"  (1.6 m)   Wt 71.2 kg   SpO2 99%   BMI 27.81 kg/m  Physical Exam Vitals and nursing note reviewed.  Constitutional:      General: She is not in acute distress.    Appearance: She is well-developed.  HENT:     Head: Normocephalic and atraumatic.     Nose: Nose normal.     Mouth/Throat:     Mouth: Mucous membranes are moist.     Pharynx: Oropharynx is clear.  Eyes:     Extraocular Movements: Extraocular movements intact.     Conjunctiva/sclera: Conjunctivae normal.  Pupils: Pupils are equal, round, and reactive to light.  Cardiovascular:     Rate and Rhythm: Normal rate and regular rhythm.     Pulses: Normal pulses.     Heart sounds: Normal heart sounds. No murmur heard. Pulmonary:     Effort: Pulmonary effort is normal. No respiratory distress.     Breath sounds: Normal breath sounds.  Abdominal:     Palpations: Abdomen is soft.     Tenderness: There is no abdominal tenderness. There is no guarding or rebound.  Musculoskeletal:        General: No swelling.     Cervical back: Normal range of motion and neck supple. No rigidity or tenderness.     Right lower leg: No edema.     Left lower leg: No edema.     Comments: Left upper extremity in sling and splint.  Fingers well-perfused.  Radial and DP pulses intact.  Skin:    General: Skin is warm and dry.      Capillary Refill: Capillary refill takes less than 2 seconds.  Neurological:     General: No focal deficit present.     Mental Status: She is alert and oriented to person, place, and time. Mental status is at baseline.     Cranial Nerves: No cranial nerve deficit.     Sensory: No sensory deficit.     Motor: No weakness.     Coordination: Coordination normal.     Comments: Patient is awake alert.  Oriented to person, legs, time, situation.  She has normal speech without dysarthria or aphasia.  Her cranial nerves are intact.  Face is symmetric.  Normal facial sensation.  No visual field cuts.  She has 5 out of 5 strength in bilateral upper and lower extremities.  Left upper extremity somewhat limited range of motion due to sling and splint and recent surgery.  She has normal symmetric sensation throughout all extremities.  She has normal finger-to-nose and heel-to-shin.  She is able to ambulate without assistance.  She is not ataxic.  Psychiatric:        Mood and Affect: Mood normal.     ED Results / Procedures / Treatments   Labs (all labs ordered are listed, but only abnormal results are displayed) Labs Reviewed  COMPREHENSIVE METABOLIC PANEL - Abnormal; Notable for the following components:      Result Value   Glucose, Bld 108 (*)    AST 44 (*)    ALT 63 (*)    All other components within normal limits  URINALYSIS, ROUTINE W REFLEX MICROSCOPIC - Abnormal; Notable for the following components:   Hgb urine dipstick TRACE (*)    All other components within normal limits  URINALYSIS, MICROSCOPIC (REFLEX) - Abnormal; Notable for the following components:   Bacteria, UA RARE (*)    All other components within normal limits  RESP PANEL BY RT-PCR (RSV, FLU A&B, COVID)  RVPGX2  CBC WITH DIFFERENTIAL/PLATELET  TROPONIN I (HIGH SENSITIVITY)  TROPONIN I (HIGH SENSITIVITY)    EKG EKG Interpretation Date/Time:  Wednesday November 09 2023 15:31:56 EST Ventricular Rate:  81 PR  Interval:  171 QRS Duration:  84 QT Interval:  373 QTC Calculation: 433 R Axis:   57  Text Interpretation: Sinus rhythm Multiple ventricular premature complexes Probable left atrial enlargement Confirmed by Fulton Reek [phone]) on 11/09/2023 3:38:13 PM  Radiology CT Angio Head Neck W WO CM Result Date: 11/09/2023 CLINICAL DATA:  Central vertigo EXAM: CT ANGIOGRAPHY HEAD AND NECK  WITH AND WITHOUT CONTRAST TECHNIQUE: Multidetector CT imaging of the head and neck was performed using the standard protocol during bolus administration of intravenous contrast. Multiplanar CT image reconstructions and MIPs were obtained to evaluate the vascular anatomy. Carotid stenosis measurements (when applicable) are obtained utilizing NASCET criteria, using the distal internal carotid diameter as the denominator. RADIATION DOSE REDUCTION: This exam was performed according to the departmental dose-optimization program which includes automated exposure control, adjustment of the mA and/or kV according to patient size and/or use of iterative reconstruction technique. CONTRAST:  75mL OMNIPAQUE IOHEXOL 350 MG/ML SOLN COMPARISON:  MRI same day FINDINGS: CT HEAD FINDINGS Brain: The brain shows a normal appearance without evidence of malformation, atrophy, old or acute small or large vessel infarction, mass lesion, hemorrhage, hydrocephalus or extra-axial collection. Vascular: No hyperdense vessel. No evidence of atherosclerotic calcification. Skull: Normal.  No traumatic finding.  No focal bone lesion. Sinuses/Orbits: Sinuses are clear. Orbits appear normal. Mastoids are clear. Other: None significant CTA NECK FINDINGS Aortic arch: Mild aortic atherosclerotic calcification. Branching pattern is normal without origin stenosis. Right carotid system: Common carotid artery widely patent to the bifurcation. Normal carotid bifurcation. Normal cervical ICA. Left carotid system: Common carotid artery widely patent to the bifurcation. Mild  calcific plaque at the bifurcation but no stenosis. Cervical ICA normal beyond. Vertebral arteries: Both vertebral artery origins are widely patent. Both vertebral arteries are normal through the cervical region to the foramen magnum. Skeleton: Ordinary mild cervical spondylosis. Other neck: No mass or lymphadenopathy. Upper chest: Lung apices are clear. Review of the MIP images confirms the above findings CTA HEAD FINDINGS Anterior circulation: Both internal carotid arteries are patent through the skull base and siphon regions. No siphon stenosis. The anterior and middle cerebral vessels are patent and normal. No stenosis or occlusion. No aneurysm or vascular malformation. Posterior circulation: Both vertebral arteries are patent to the basilar artery. No basilar stenosis. Posterior circulation branch vessels are normal. Posterior cerebral arteries receive most of there supply from the anterior circulation. Venous sinuses: Patent and normal. Anatomic variants: None significant. Review of the MIP images confirms the above findings IMPRESSION: 1. Normal head CT. 2. Mild calcific plaque at the left carotid bifurcation but no stenosis. Otherwise, normal appearance of the neck vessels. 3. Normal intracranial large and medium vessels. 4. Aortic atherosclerosis, minimal. Electronically Signed   By: Paulina Fusi M.D.   On: 11/09/2023 18:43   DG Chest 2 View Result Date: 11/09/2023 CLINICAL DATA:  Weakness and dizziness. EXAM: CHEST - 2 VIEW COMPARISON:  None Available. FINDINGS: The heart size and mediastinal contours are within normal limits. Aortic arch atherosclerosis. No focal consolidation, pleural effusion, or pneumothorax. Mild levocurvature of the midthoracic spine. No acute osseous abnormality. IMPRESSION: No acute cardiopulmonary findings. Electronically Signed   By: Hart Robinsons M.D.   On: 11/09/2023 16:52   MR Brain Wo Contrast (neuro protocol) Result Date: 11/09/2023 CLINICAL DATA:  Transient  ischemic attack. Generalized weakness and dizziness. Tingling. EXAM: MRI HEAD WITHOUT CONTRAST TECHNIQUE: Multiplanar, multiecho pulse sequences of the brain and surrounding structures were obtained without intravenous contrast. COMPARISON:  None Available. FINDINGS: Brain: Diffusion imaging does not show any acute or subacute infarction or other cause of restricted diffusion. No focal abnormality affects the brainstem or cerebellum. No abnormality of the thalami or basal ganglia. Few scattered punctate foci of T2 and FLAIR signal within the hemispheric white matter consistent with mild chronic small-vessel ischemic change. No cortical or large vessel territory infarction. No mass lesion,  hemorrhage, hydrocephalus or extra-axial collection. Vascular: Major vessels at the base of the brain show flow. Skull and upper cervical spine: Negative Sinuses/Orbits: Clear/normal Other: None IMPRESSION: No acute or reversible finding. 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 Electronically Signed   By: Paulina Fusi M.D.   On: 11/09/2023 16:26    Procedures Procedures    Medications Ordered in ED Medications  sodium chloride 0.9 % bolus 1,000 mL (1,000 mLs Intravenous New Bag/Given 11/09/23 1644)  acetaminophen (TYLENOL) 160 MG/5ML solution 650 mg (650 mg Oral Given 11/09/23 1832)  iohexol (OMNIPAQUE) 350 MG/ML injection 75 mL (75 mLs Intravenous Contrast Given 11/09/23 1824)    ED Course/ Medical Decision Making/ A&P Clinical Course as of 11/09/23 1935  Wed Nov 09, 2023  1637 Head felling better, legs still feel tingling. [JD]  1757 Patient still dizzy and having some recurrent dizziness.  Remains in normal neurologic exam.  Added on COVID and flu given transaminitis and some chills.  Urinalysis pending.  Added on CTA as well to look for abnormality related to vessels. [JD]    Clinical Course User Index [JD] Laurence Spates, MD                                 Medical  Decision Making Amount and/or Complexity of Data Reviewed Labs: ordered. Radiology: ordered.  Risk OTC drugs. Prescription drug management.   Medical Decision Making:   Arely C Hakimi is a 68 y.o. female who presented to the ED today with dizziness, generalized weakness, tingling feeling, fatigue.  Vital signs on for hypertension.  On exam she is overall well-appearing.  She has had a couple days of tingling to her left leg.  A few hours ago, she is unsure exactly what time she developed feeling fatigue, dizziness which she describes as presyncope and a tingling feeling all over as well as generalized weakness.  She has no numbness or focal weakness.  On exam she has completely normal neurologic exam.  Given this I do not think she meets code stroke criteria.  Her symptoms seem more consistent with fatigue and presyncope.  Will evaluate her and rule out ACS, infection, arrhythmia.  We do have MRI available here in this facility currently, out of abundance of caution will rule out possible small stroke that could have caused her symptoms although her bilateral tingling sensation with presyncope seems less consistent with this.  Patient placed on continuous vitals and telemetry monitoring while in ED which was reviewed periodically.  Reviewed and confirmed nursing documentation for past medical history, family history, social history.  Reassessment and Plan:   MRI without acute findings.  We did discuss the small vessel disease seen.  I obtained a CTA head and neck due to persistent intermittent dizziness to rule out arterial abnormality such as dissection of the vertebral arteries.  This looks okay, she has some slight atherosclerosis of the left carotid artery but notes stenosis or anything to contribute to her symptoms.  I discussed following up close with her primary care doctor for this.  Her chest x-ray and lab work are reassuring.  She has mild transaminitis which we discussed, she will need to  follow-up with her doctor about this.  I wonder if she may be dehydrated, she has had some decreased p.o. intake this week in the setting of her recent procedure.  She is feeling a lot better.  She is  tolerant p.o. and ambulating without difficulty.  Remains with normal neurologic exam.  I think she can safely follow-up closely with her PCP.  Strict return precautions were given.  Patient is comfortable with this plan.   Patient's presentation is most consistent with acute complicated illness / injury requiring diagnostic workup.           Final Clinical Impression(s) / ED Diagnoses Final diagnoses:  Dizziness    Rx / DC Orders ED Discharge Orders     None         Laurence Spates, MD 11/09/23 1935

## 2023-11-09 NOTE — ED Notes (Signed)
 Pt given gatorade and snacks for PO challenge

## 2023-11-09 NOTE — ED Triage Notes (Signed)
 Pt c/o generalized weakness & dizziness that started about 3-4 hours ago. Pt c/o tingling all over but more on the left side for the last couple of nights.

## 2023-11-21 IMAGING — MG MM DIGITAL SCREENING BILAT W/ TOMO AND CAD
8 series · 9 of 24 positions shown · non-contrast
Comparison: Previous exam(s).

CLINICAL DATA: Screening.

EXAM:
DIGITAL SCREENING BILATERAL MAMMOGRAM WITH TOMOSYNTHESIS AND CAD
TECHNIQUE: Bilateral screening digital craniocaudal and mediolateral oblique
mammograms were obtained. Bilateral screening digital breast
tomosynthesis was performed. The images were evaluated with
computer-aided detection.

[L CC synth-2D]
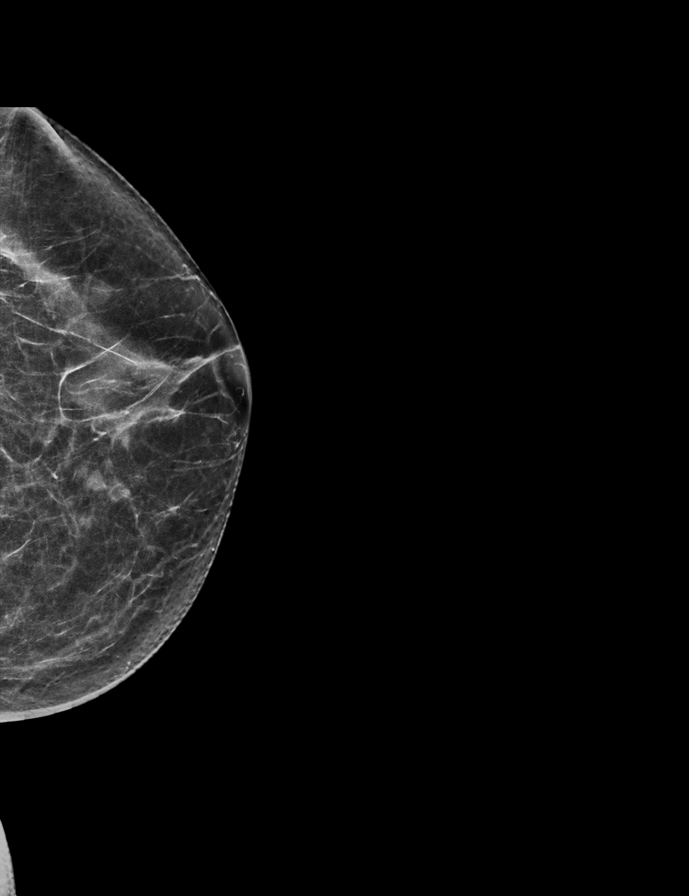

[R MLO synth-2D]
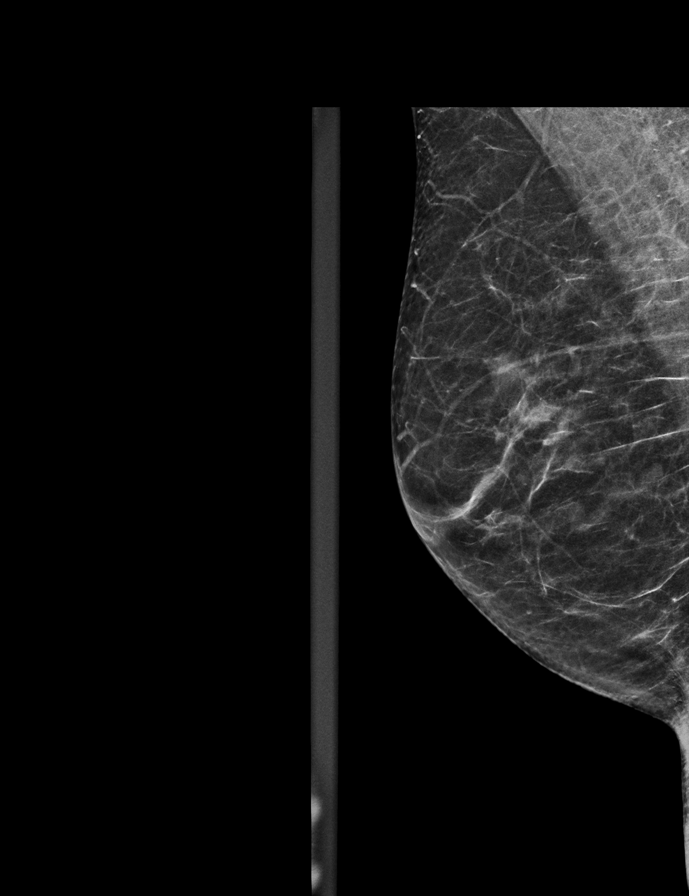

[R CC synth-2D]
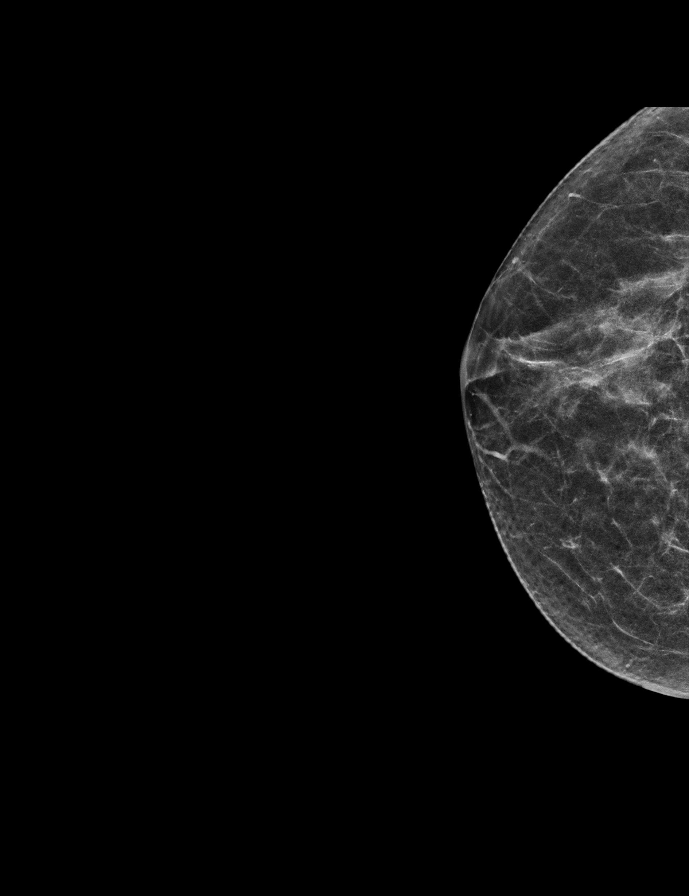

[L MLO synth-2D]
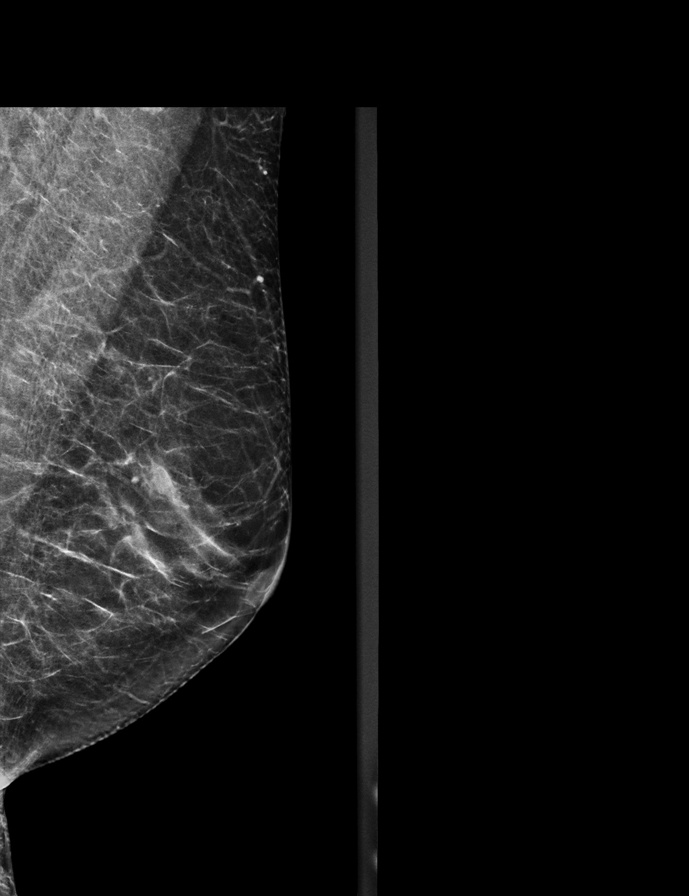

[R MLO tomo · 2 of 52 frames shown]
[frame 17/52]
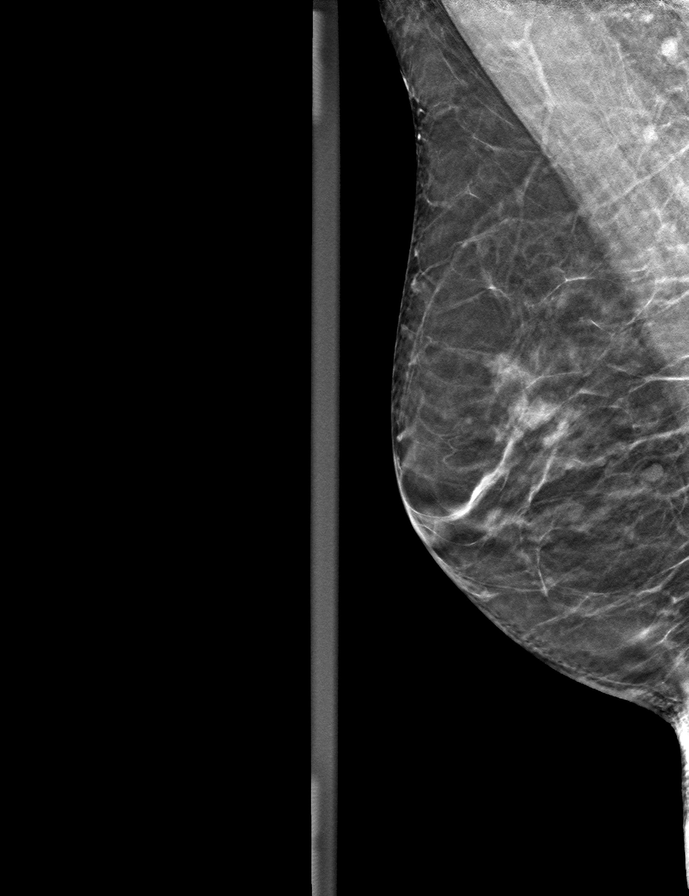
[frame 27/52]
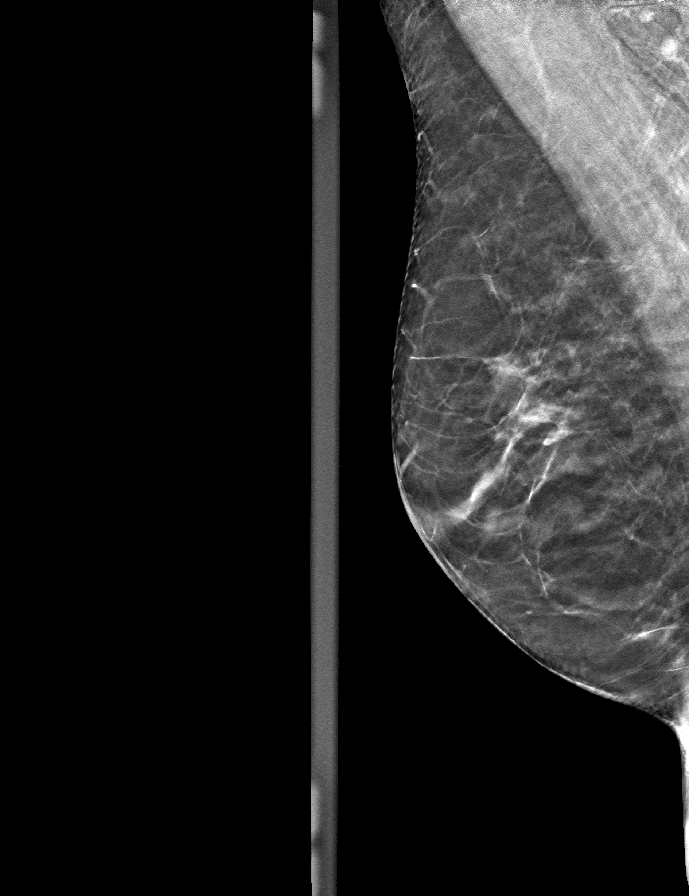

[L MLO tomo · tomo slice 30/59.0]
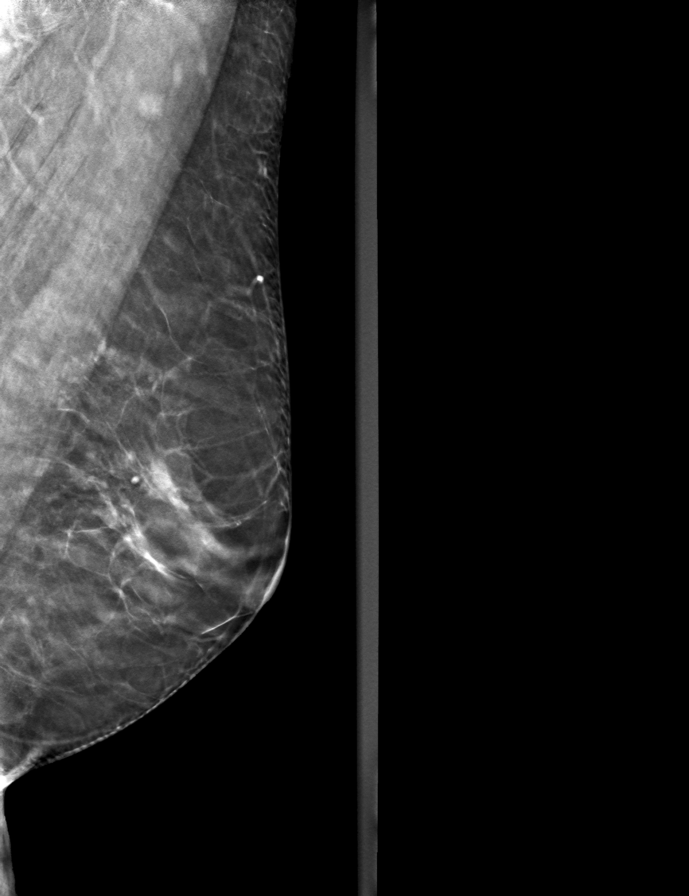

[R CC tomo · tomo slice 27/52.0]
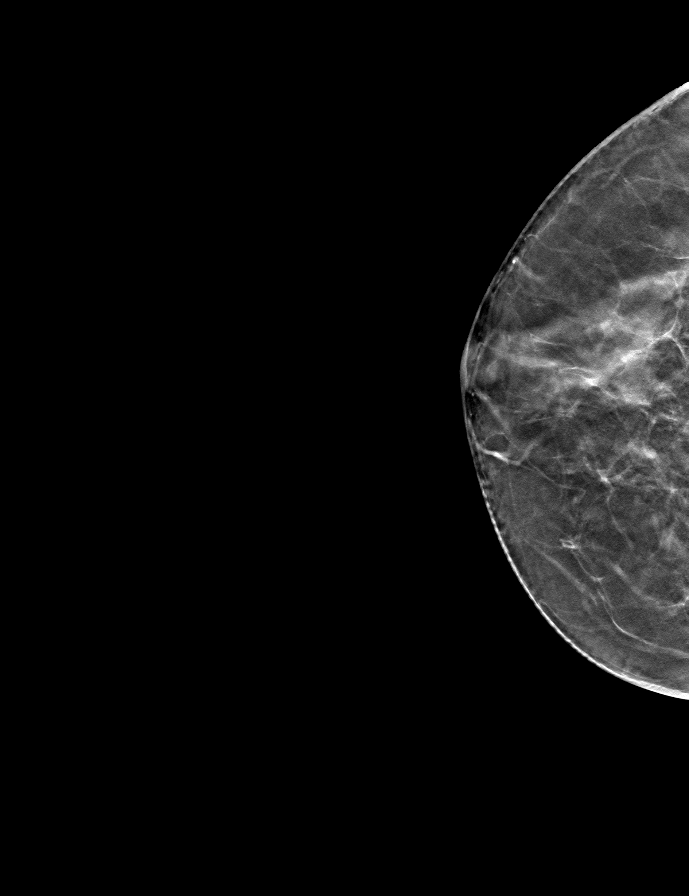

[L CC tomo · tomo slice 31/60.0]
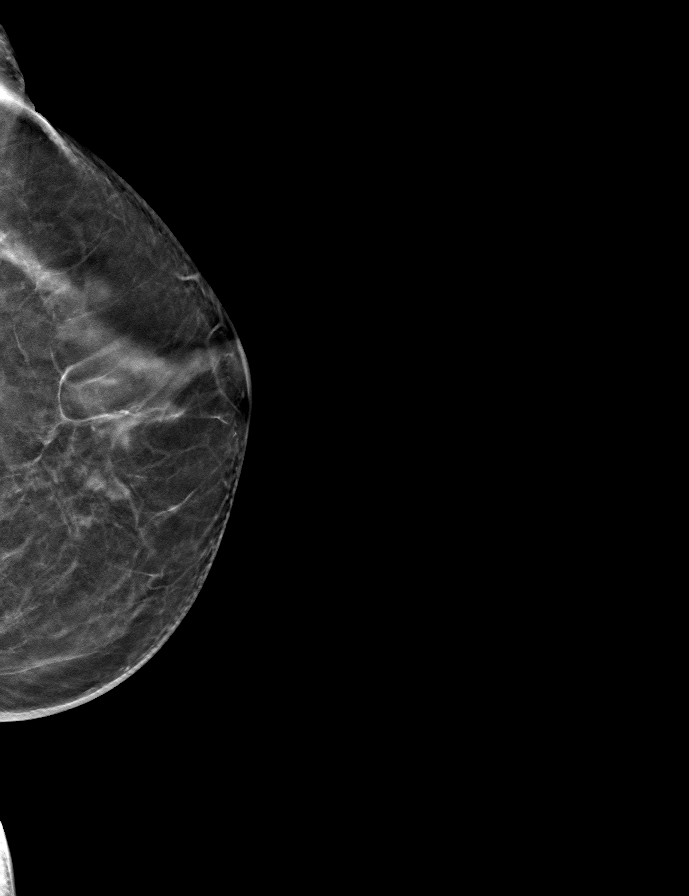

[9 of 24 positions shown; findings below may reference images not displayed]

ACR Breast Density Category b: There are scattered areas of
fibroglandular density.
FINDINGS: There are no findings suspicious for malignancy.
IMPRESSION: No mammographic evidence of malignancy. A result letter of this
screening mammogram will be mailed directly to the patient.

RECOMMENDATION:
Screening mammogram in one year. (Code:51-O-LD2)

BI-RADS CATEGORY  1: Negative.

## 2024-09-26 ENCOUNTER — Other Ambulatory Visit: Payer: Self-pay | Admitting: Family Medicine

## 2024-09-26 DIAGNOSIS — M810 Age-related osteoporosis without current pathological fracture: Secondary | ICD-10-CM

## 2024-10-09 ENCOUNTER — Other Ambulatory Visit: Payer: Self-pay | Admitting: Family Medicine

## 2024-10-09 DIAGNOSIS — Z1231 Encounter for screening mammogram for malignant neoplasm of breast: Secondary | ICD-10-CM

## 2024-10-29 ENCOUNTER — Ambulatory Visit

## 2025-02-06 ENCOUNTER — Other Ambulatory Visit
